# Patient Record
Sex: Female | Born: 1985 | Hispanic: Yes | Smoking: Never smoker
Health system: Southern US, Community
[De-identification: ages and names within clinical notes are randomized; demographics above are authoritative.]

---

## 2014-05-24 NOTE — L&D Delivery Note (Cosign Needed)
Delivery Note Pt pushed well and at 4:34 AM a viable female was delivered via VBAC, Spontaneous (Presentation: Right Occiput Anterior).  APGAR: 9, 9; weight 6 lb 12.6 oz (3080 g).  Cord clamped and cut by FOB; hospital lab cord blood collected. Placenta status: Intact, Spontaneous.  Cord: 3 vessels   Anesthesia: None  Episiotomy: None Lacerations: None Est. Blood Loss (mL): 200  Mom to postpartum.  Baby to Couplet care / Skin to Skin.   Cam HaiSHAW, KIMBERLY CNM 05/08/2015, 4:51 AM

## 2015-01-06 ENCOUNTER — Other Ambulatory Visit (HOSPITAL_COMMUNITY): Payer: Self-pay | Admitting: Physician Assistant

## 2015-01-06 DIAGNOSIS — Z3A22 22 weeks gestation of pregnancy: Secondary | ICD-10-CM

## 2015-01-06 DIAGNOSIS — Z3689 Encounter for other specified antenatal screening: Secondary | ICD-10-CM

## 2015-01-06 LAB — OB RESULTS CONSOLE GC/CHLAMYDIA
CHLAMYDIA, DNA PROBE: NEGATIVE
GC PROBE AMP, GENITAL: NEGATIVE

## 2015-01-06 LAB — OB RESULTS CONSOLE HEPATITIS B SURFACE ANTIGEN: Hepatitis B Surface Ag: NEGATIVE

## 2015-01-06 LAB — OB RESULTS CONSOLE HIV ANTIBODY (ROUTINE TESTING): HIV: NONREACTIVE

## 2015-01-06 LAB — OB RESULTS CONSOLE ABO/RH: RH Type: POSITIVE

## 2015-01-06 LAB — OB RESULTS CONSOLE ANTIBODY SCREEN: Antibody Screen: NEGATIVE

## 2015-01-06 LAB — OB RESULTS CONSOLE RPR: RPR: NONREACTIVE

## 2015-01-06 LAB — OB RESULTS CONSOLE RUBELLA ANTIBODY, IGM: Rubella: IMMUNE

## 2015-01-07 ENCOUNTER — Other Ambulatory Visit (HOSPITAL_COMMUNITY): Payer: Self-pay | Admitting: Physician Assistant

## 2015-01-07 ENCOUNTER — Ambulatory Visit (HOSPITAL_COMMUNITY)
Admission: RE | Admit: 2015-01-07 | Discharge: 2015-01-07 | Disposition: A | Discharge status: Home / Self Care | Payer: Medicaid Other | Source: Ambulatory Visit | Attending: Physician Assistant | Admitting: Physician Assistant

## 2015-01-07 ENCOUNTER — Encounter (HOSPITAL_COMMUNITY): Payer: Self-pay

## 2015-01-07 DIAGNOSIS — Z3A22 22 weeks gestation of pregnancy: Secondary | ICD-10-CM

## 2015-01-07 DIAGNOSIS — Z36 Encounter for antenatal screening of mother: Secondary | ICD-10-CM | POA: Insufficient documentation

## 2015-01-07 DIAGNOSIS — Z3689 Encounter for other specified antenatal screening: Secondary | ICD-10-CM

## 2015-01-07 IMAGING — US US MFM OB COMP +14 WKS
1 series · 12 of 28 positions shown · non-contrast
Comparison: none

[Series 1: us ob +14 all · 12 of 69 slices shown]
[im 3/69]
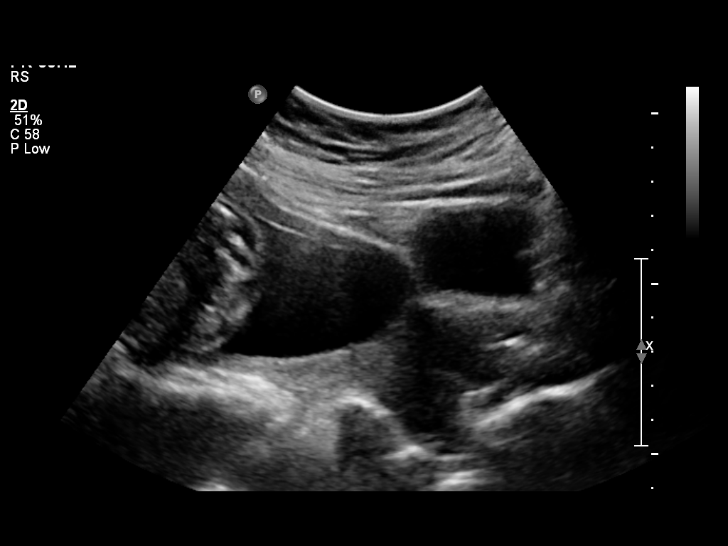
[im 8/69]
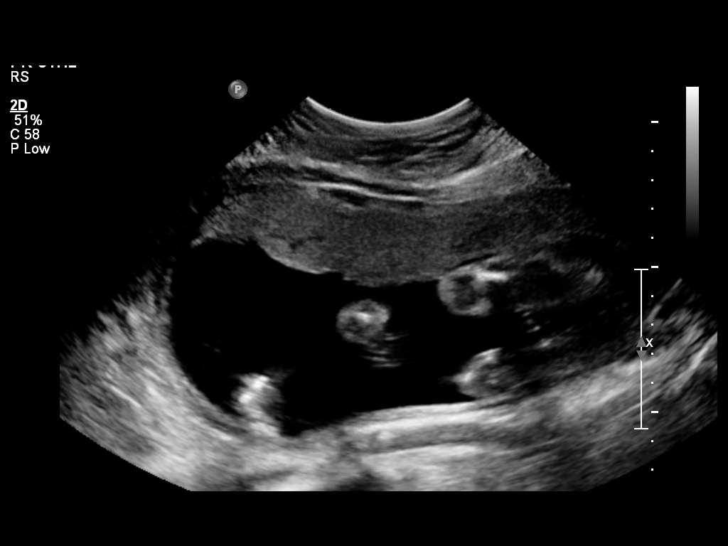
[im 13/69]
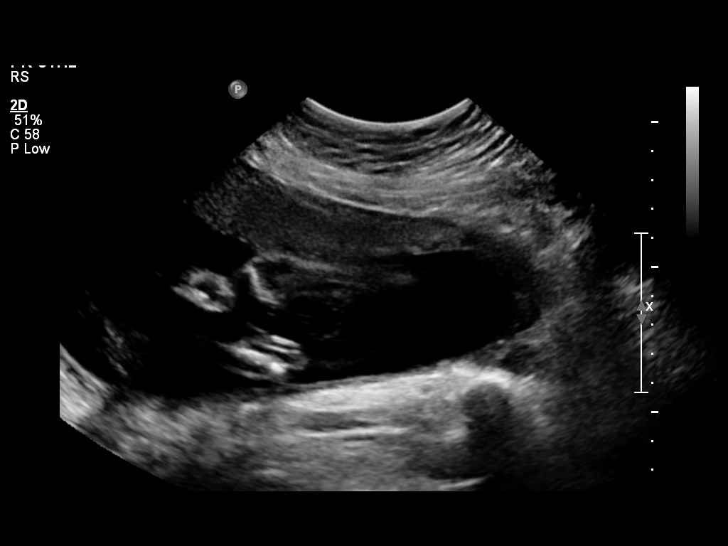
[im 21/69]
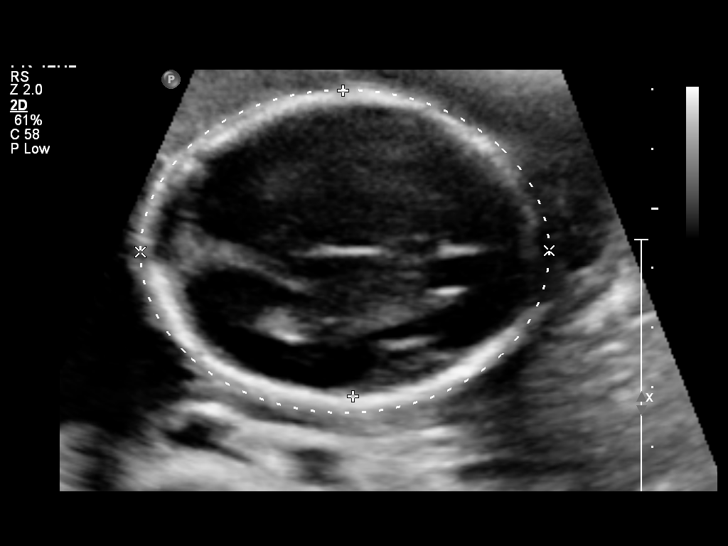
[im 26/69]
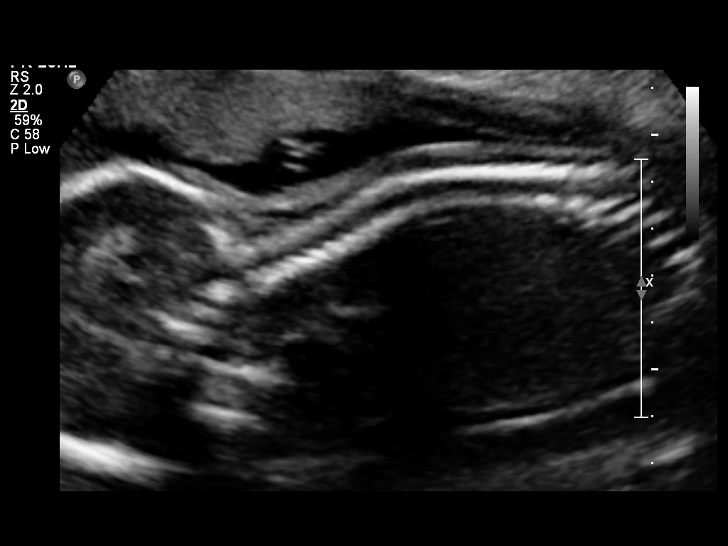
[im 31/69]
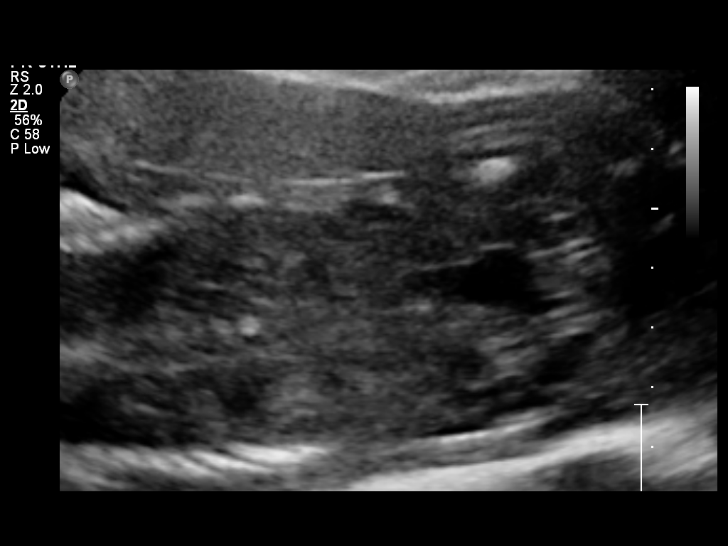
[im 38/69]
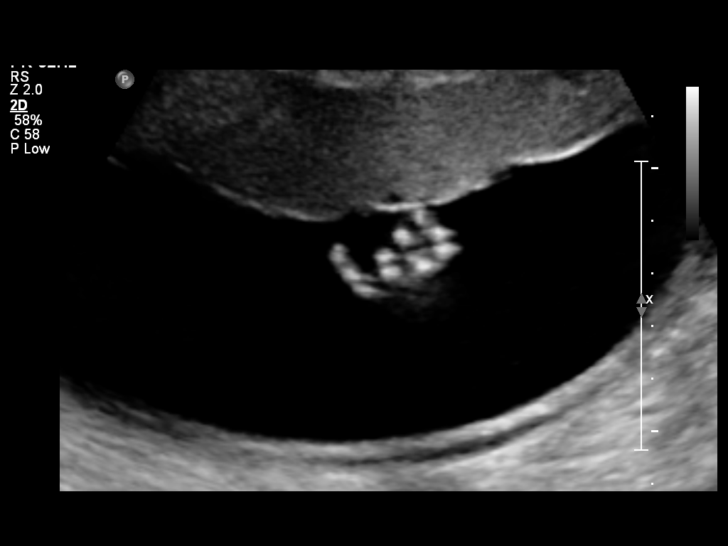
[im 43/69]
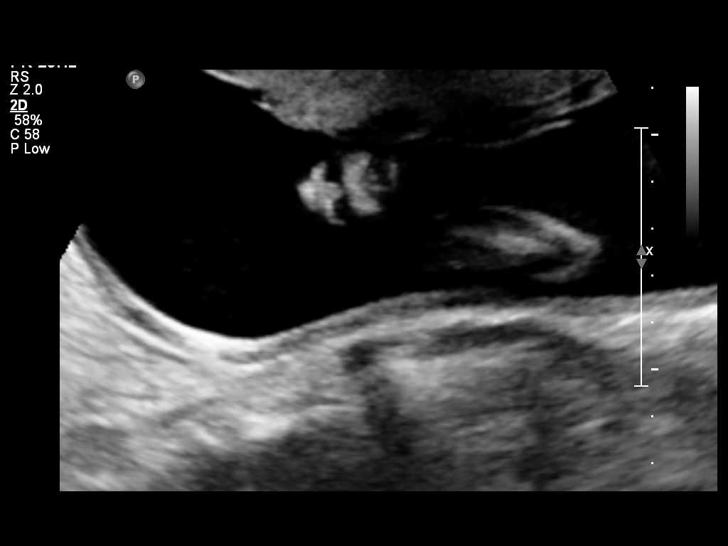
[im 48/69]
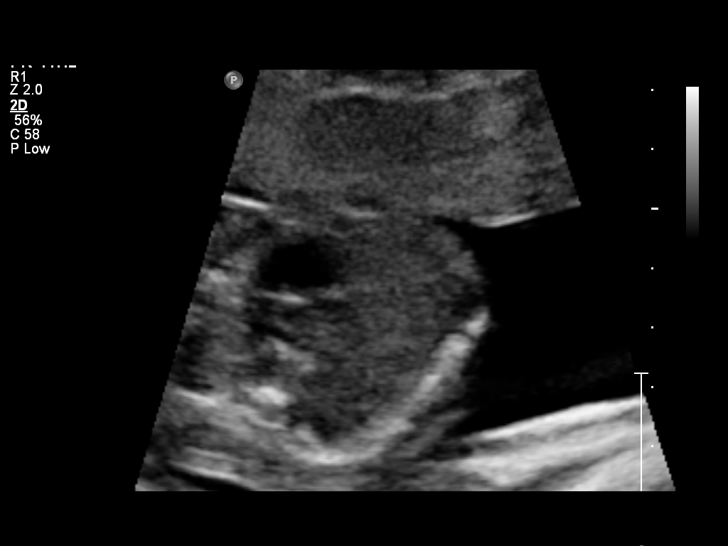
[im 56/69]
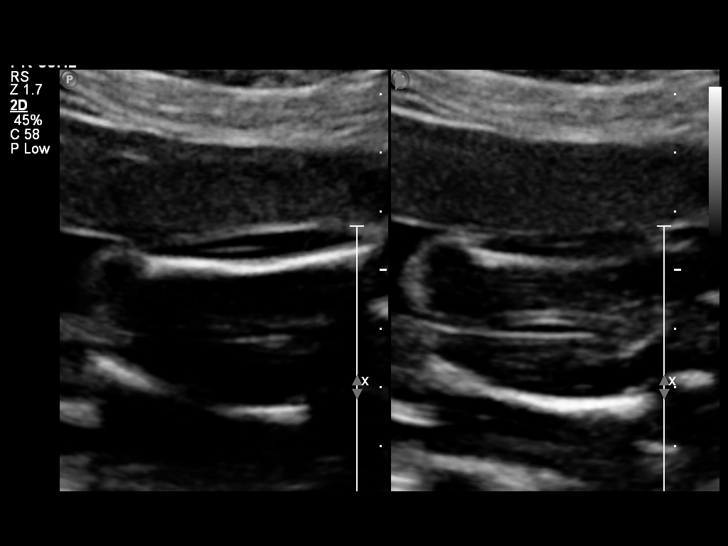
[im 61/69]
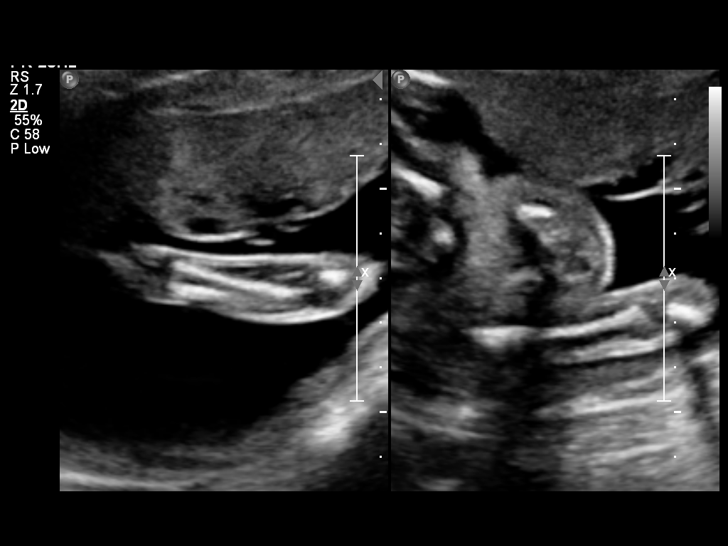
[im 66/69]
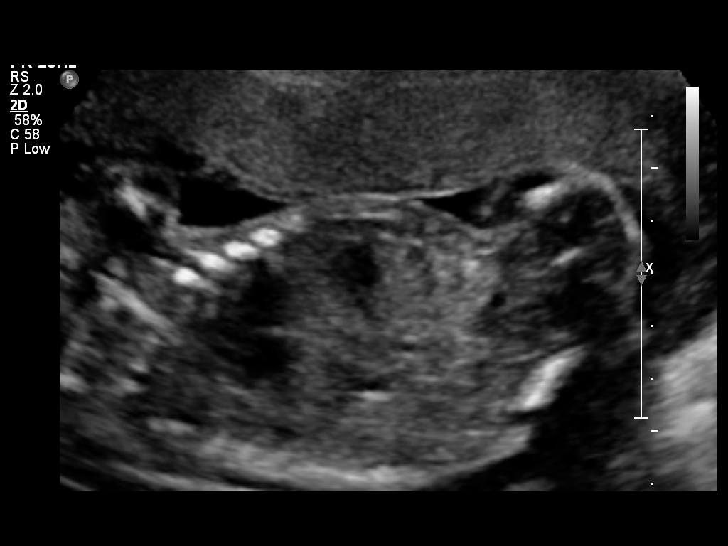

[12 of 28 positions shown; findings below may reference images not displayed]

OBSTETRICS REPORT
(Signed Final [DATE] [DATE])

Name:       DON LOLITO                     Visit  [DATE] [DATE]
Date:

Service(s) Provided

Indications

Basic anatomic survey                                  z36
22 weeks gestation of pregnancy
Fetal Evaluation

Num Of             1
Fetuses:
Fetal Heart        165                          bpm
Rate:
Cardiac Activity:  Observed
Presentation:      Breech
Placenta:          Anterior, above cervical
os
P. Cord            Visualized, central
Insertion:

Amniotic Fluid
AFI FV:      Subjectively within normal limits
Larg Pckt:    4.99   cm
Biometry

BPD:     50.6   m    G. Age:   21w 2d                 CI:        67.58   70 - 86
m
FL/HC:      18.7   18.4 -
20.2
HC:       197   m    G. Age:   21w 6d        34  %    HC/AC:      1.10   1.06 -
m
AC:     179.2   m    G. Age:   22w 6d        67  %    FL/BPD      72.9   71 - 87
m                                     :
FL:      36.9   m    G. Age:   21w 5d        31  %    FL/AC:      20.6   20 - 24
m
HUM:     35.8   m    G. Age:   22w 3d        59  %
m
CER:     22.9   m    G. Age:   21w 3d        34  %
m
Est.         487   gm    1 lb 1 oz      51   %
FW:
Gestational Age

LMP:           22w 0d        Date:  [DATE]                  EDD:   [DATE]
U/S Today:     22w 0d                                         EDD:   [DATE]
Best:          22w 0d    Det. By:   LMP  ([DATE])           EDD:   [DATE]
Anatomy

Cranium:          Appears normal         Aortic Arch:       Appears normal
Fetal Cavum:      Appears normal         Ductal Arch:       Appears normal
Ventricles:       Appears normal         Diaphragm:         Appears normal
Choroid Plexus:   Appears normal         Stomach:           Appears normal,
left sided
Cerebellum:       Appears normal         Abdomen:           Appears normal
Posterior         Appears normal         Abdominal          Appears nml (cord
Fossa:                                   Wall:              insert, abd wall)
Nuchal Fold:      Appears normal         Cord Vessels:      Appears normal (3
vessel cord)
Face:             Appears normal         Kidneys:           Appear normal
(orbits and profile)
Lips:             Appears normal         Bladder:           Appears normal
Heart:            Appears normal         Spine:             Limited views
(4CH, axis, and                           appear normal
situs)
RVOT:             Appears normal         Lower              Appears normal
Extremities:
LVOT:             Appears normal         Upper              Appears normal
Extremities:

Other:   Fetus appears to be a female. Heels visualized. Right 5th digit
visualized. Nasal bone visualized.
Targeted Anatomy

Fetal Central Nervous System
Cisterna
Magna:
Cervix Uterus Adnexa

Cervical Length:    3         cm

Cervix:       Normal appearance by transabdominal scan.
Uterus:       No abnormality visualized.
Cul De Sac:   No free fluid seen.

Left Ovary:    Within normal limits.
Right Ovary:   Within normal limits.

Adnexa:     No abnormality visualized.
Impression

SIUP at 22+0 weeks
Normal detailed fetal anatomy; limited views of spine
Normal amniotic fluid volume
Measurements consistent with LMP dating
Recommendations

Follow-up as clinically indicated

## 2015-04-25 LAB — OB RESULTS CONSOLE GBS: STREP GROUP B AG: POSITIVE

## 2015-05-08 ENCOUNTER — Inpatient Hospital Stay (HOSPITAL_COMMUNITY)
Admission: AD | Admit: 2015-05-08 | Discharge: 2015-05-10 | DRG: 775 | Disposition: A | Discharge status: Home / Self Care | Payer: Medicaid Other | Source: Ambulatory Visit | Attending: Obstetrics and Gynecology | Admitting: Obstetrics and Gynecology

## 2015-05-08 ENCOUNTER — Encounter (HOSPITAL_COMMUNITY): Payer: Self-pay

## 2015-05-08 DIAGNOSIS — O99824 Streptococcus B carrier state complicating childbirth: Secondary | ICD-10-CM

## 2015-05-08 DIAGNOSIS — IMO0001 Reserved for inherently not codable concepts without codable children: Secondary | ICD-10-CM

## 2015-05-08 DIAGNOSIS — O34219 Maternal care for unspecified type scar from previous cesarean delivery: Secondary | ICD-10-CM

## 2015-05-08 DIAGNOSIS — O26893 Other specified pregnancy related conditions, third trimester: Secondary | ICD-10-CM | POA: Diagnosis present

## 2015-05-08 DIAGNOSIS — Z3A39 39 weeks gestation of pregnancy: Secondary | ICD-10-CM

## 2015-05-08 DIAGNOSIS — B951 Streptococcus, group B, as the cause of diseases classified elsewhere: Secondary | ICD-10-CM

## 2015-05-08 LAB — CBC
HEMATOCRIT: 42 % (ref 36.0–46.0)
HEMOGLOBIN: 14.1 g/dL (ref 12.0–15.0)
MCH: 28.7 pg (ref 26.0–34.0)
MCHC: 33.6 g/dL (ref 30.0–36.0)
MCV: 85.5 fL (ref 78.0–100.0)
Platelets: 177 10*3/uL (ref 150–400)
RBC: 4.91 MIL/uL (ref 3.87–5.11)
RDW: 15 % (ref 11.5–15.5)
WBC: 7.7 10*3/uL (ref 4.0–10.5)

## 2015-05-08 LAB — RPR: RPR Ser Ql: NONREACTIVE

## 2015-05-08 MED ORDER — SIMETHICONE 80 MG PO CHEW: 80.0000 mg | CHEWABLE_TABLET | ORAL | Status: DC | PRN

## 2015-05-08 MED ORDER — OXYTOCIN 40 UNITS IN LACTATED RINGERS INFUSION - SIMPLE MED
62.5000 mL/h | INTRAVENOUS | Status: DC
  Filled 2015-05-08: qty 1000

## 2015-05-08 MED ORDER — ONDANSETRON HCL 4 MG PO TABS: 4.0000 mg | ORAL_TABLET | ORAL | Status: DC | PRN

## 2015-05-08 MED ORDER — PRENATAL MULTIVITAMIN CH
1.0000 | ORAL_TABLET | Freq: Every day | ORAL | Status: DC
  Administered 2015-05-08 – 2015-05-09 (×2): 1 via ORAL
  Filled 2015-05-08 (×2): qty 1

## 2015-05-08 MED ORDER — ONDANSETRON HCL 4 MG/2ML IJ SOLN: 4.0000 mg | Freq: Four times a day (QID) | INTRAMUSCULAR | Status: DC | PRN

## 2015-05-08 MED ORDER — OXYCODONE-ACETAMINOPHEN 5-325 MG PO TABS: 2.0000 | ORAL_TABLET | ORAL | Status: DC | PRN

## 2015-05-08 MED ORDER — OXYTOCIN BOLUS FROM INFUSION
500.0000 mL | INTRAVENOUS | Status: DC
  Administered 2015-05-08: 500 mL via INTRAVENOUS

## 2015-05-08 MED ORDER — SODIUM CHLORIDE 0.9 % IV SOLN
2.0000 g | Freq: Once | INTRAVENOUS | Status: AC
  Administered 2015-05-08: 2 g via INTRAVENOUS
  Filled 2015-05-08: qty 2000

## 2015-05-08 MED ORDER — ONDANSETRON HCL 4 MG/2ML IJ SOLN
4.0000 mg | INTRAMUSCULAR | Status: DC | PRN
Start: 2015-05-08 — End: 2015-05-10

## 2015-05-08 MED ORDER — LACTATED RINGERS IV SOLN
INTRAVENOUS | Status: DC
  Administered 2015-05-08: 03:00:00 via INTRAVENOUS

## 2015-05-08 MED ORDER — SENNOSIDES-DOCUSATE SODIUM 8.6-50 MG PO TABS
2.0000 | ORAL_TABLET | ORAL | Status: DC
  Administered 2015-05-08 – 2015-05-10 (×2): 2 via ORAL
  Filled 2015-05-08 (×2): qty 2

## 2015-05-08 MED ORDER — ACETAMINOPHEN 325 MG PO TABS: 650.0000 mg | ORAL_TABLET | ORAL | Status: DC | PRN

## 2015-05-08 MED ORDER — LACTATED RINGERS IV SOLN: 500.0000 mL | INTRAVENOUS | Status: DC | PRN

## 2015-05-08 MED ORDER — OXYCODONE-ACETAMINOPHEN 5-325 MG PO TABS: 1.0000 | ORAL_TABLET | ORAL | Status: DC | PRN

## 2015-05-08 MED ORDER — IBUPROFEN 600 MG PO TABS
600.0000 mg | ORAL_TABLET | Freq: Four times a day (QID) | ORAL | Status: DC
  Administered 2015-05-08 – 2015-05-10 (×8): 600 mg via ORAL
  Filled 2015-05-08 (×8): qty 1

## 2015-05-08 MED ORDER — BENZOCAINE-MENTHOL 20-0.5 % EX AERO: 1.0000 "application " | INHALATION_SPRAY | CUTANEOUS | Status: DC | PRN

## 2015-05-08 MED ORDER — WITCH HAZEL-GLYCERIN EX PADS: 1.0000 "application " | MEDICATED_PAD | CUTANEOUS | Status: DC | PRN

## 2015-05-08 MED ORDER — DIPHENHYDRAMINE HCL 25 MG PO CAPS: 25.0000 mg | ORAL_CAPSULE | Freq: Four times a day (QID) | ORAL | Status: DC | PRN

## 2015-05-08 MED ORDER — FLEET ENEMA 7-19 GM/118ML RE ENEM: 1.0000 | ENEMA | RECTAL | Status: DC | PRN

## 2015-05-08 MED ORDER — DIBUCAINE 1 % RE OINT: 1.0000 | TOPICAL_OINTMENT | RECTAL | Status: DC | PRN

## 2015-05-08 MED ORDER — TETANUS-DIPHTH-ACELL PERTUSSIS 5-2.5-18.5 LF-MCG/0.5 IM SUSP: 0.5000 mL | Freq: Once | INTRAMUSCULAR | Status: DC

## 2015-05-08 MED ORDER — LIDOCAINE HCL (PF) 1 % IJ SOLN
30.0000 mL | INTRAMUSCULAR | Status: DC | PRN
  Filled 2015-05-08: qty 30

## 2015-05-08 MED ORDER — CITRIC ACID-SODIUM CITRATE 334-500 MG/5ML PO SOLN: 30.0000 mL | ORAL | Status: DC | PRN

## 2015-05-08 MED ORDER — ZOLPIDEM TARTRATE 5 MG PO TABS: 5.0000 mg | ORAL_TABLET | Freq: Every evening | ORAL | Status: DC | PRN

## 2015-05-08 MED ORDER — LANOLIN HYDROUS EX OINT: TOPICAL_OINTMENT | CUTANEOUS | Status: DC | PRN

## 2015-05-08 NOTE — Lactation Note (Signed)
This note was copied from the chart of Kerri Fernandez. Lactation Consultation Note  Patient Name: Kerri Salomon [facility]Rosa [facility] Colony[facility] [id number]'s Date: 05/08/2015 Reason for consult: Initial assessment   Spoke with mother with assistance of Hospital Interpreter [place] Alvarez. Mom is an experienced BF mom. Infant has BF x 2 for 10 minutes. She is currently asleep beside mom. Enc mom to feed 8-12 x in 24 hours at first feeding cues. Discussed NB feeding behavior, stomach size colostrum, milk coming to volume, NB Nutritional needs, I/O and cluster feeding. Gave mom Spanish [employer] Brochure, advised on OP Services, Support Groups, and phone #. [facility] and has a Copywriter, advertisingersonal DEBP at home. Mom without questions at this time. Enc mom to call for assistance as needed.   Maternal Data Formula Feeding for Exclusion: No Does the patient have breastfeeding experience prior to this delivery?: Yes  Feeding Feeding Type: Breast Fed Length of feed: 10 min  LATCH Score/Interventions                      Lactation Tools Discussed/Used WIC Program: Yes   Consult Status Consult Status: Follow-up Date: 05/09/15 Follow-up type: In-patient    Silas FloodSharon S Hice 05/08/2015, 11:30 AM

## 2015-05-08 NOTE — MAU Note (Signed)
Ctx since mdnight. Mucus plug fell out. Good fetal movemnt. V-back

## 2015-05-08 NOTE — H&P (Signed)
Kerri Fernandez is a 29 y.o. female 533P2002 @ 39.2wks by LMP and confirmed by 22wk scan presenting with reg ctx since midnight. Denies leaking or bldg. Reports +FM. [facility] and has been remarkable for 1) vag del followed by C/S for FTP- desires VBAC and consent signed 2) GBS pos 3) abnormal glucola followed by nl 3hr  History OB History    Gravida Para Term Preterm AB TAB SAB Ectopic Multiple Living   1              No past medical history on file. No past surgical history on file. Family History: family history is not on file. Social History:  has no tobacco, alcohol, and drug history on file.   Prenatal Transfer Tool  Maternal Diabetes: No Genetic Screening: Declined Maternal Ultrasounds/Referrals: Normal Fetal Ultrasounds or other Referrals:  None Maternal Substance Abuse:  No Significant Maternal Medications:  None Significant Maternal Lab Results:  Lab values include: Group B Strep positive Other Comments:  None  ROS Cx in MAU: rim/membranes palpated/vtx   Height 5\' 2"  (1.575 m), weight 83.915 kg (185 lb). Exam Physical Exam  Constitutional: She is oriented to person, place, and time. She appears well-developed.  HENT:  Head: Normocephalic.  Neck: Normal range of motion.  Cardiovascular: Normal rate.   Respiratory: Effort normal.  GI:  EFM 150s, +accels, occ mi variables Ctx q 3-4 mins  Musculoskeletal: Normal range of motion.  Neurological: She is alert and oriented to person, place, and time.  Skin: Skin is warm and dry.  Psychiatric: She has a normal mood and affect. Her behavior is normal. Thought content normal.    Prenatal labs: ABO, Rh: O/Positive/-- (08/15 0000) Antibody: Negative (08/15 0000) Rubella: Immune (08/15 0000) RPR: Nonreactive (08/15 0000)  HBsAg: Negative (08/15 0000)  HIV: Non-reactive (08/15 0000)  GBS: Positive (12/02 0000)   Assessment/Plan: IUP@term  Active labor/transition TOLAC GBS pos  [facility] Amp for GBS ppx Anticipate SVD   Makiyah Zentz CNM 05/08/2015, 3:22 AM

## 2015-05-08 NOTE — Progress Notes (Signed)
Notified of pt arrival and vaginal exam of 9cm. Will admit to labor and delivery

## 2015-05-08 NOTE — Progress Notes (Signed)
I stopped to check on patients need, I ordered her meals, also I assisted [facility]by Orlan LeavensViria Alvarez, Spanish Interpreter

## 2015-05-09 NOTE — Progress Notes (Signed)
I assisted Drenda FreezeFran CNM with questions, oredered meals, by Orlan LeavensViria Alvarez Spanish Interpreter.

## 2015-05-09 NOTE — Progress Notes (Signed)
Post Partum Day 1 Subjective: no complaints, up ad lib, voiding and tolerating PO, small lochia, plans to breastfeed, undecided about BC, maybe condoms  Objective: Blood pressure 105/65, pulse 75, temperature 98 F (36.7 C), temperature source Oral, resp. rate 16, height 5\' 2"  (1.575 m), weight 83.915 kg (185 lb), SpO2 99 %, unknown if currently breastfeeding.  Physical Exam:  General: alert, cooperative and no distress Lochia:normal flow Chest: CTAB Heart: RRR no m/r/g Abdomen: +BS, soft, nontender,  Uterine Fundus: firm DVT Evaluation: No evidence of DVT seen on physical exam. Extremities: no edema   Recent Labs  05/08/15 0320  HGB 14.1  HCT 42.0    Assessment/Plan: Plan for discharge tomorrow   LOS: 1 day   CRESENZO-DISHMAN,Joram Venson 05/09/2015, 7:31 AM

## 2015-05-10 DIAGNOSIS — IMO0001 Reserved for inherently not codable concepts without codable children: Secondary | ICD-10-CM

## 2015-05-10 DIAGNOSIS — B951 Streptococcus, group B, as the cause of diseases classified elsewhere: Secondary | ICD-10-CM

## 2015-05-10 MED ORDER — PRENATAL MULTIVITAMIN CH: 1.0000 | ORAL_TABLET | Freq: Every day | ORAL | Status: AC

## 2015-05-10 MED ORDER — IBUPROFEN 600 MG PO TABS: 600.0000 mg | ORAL_TABLET | Freq: Four times a day (QID) | ORAL | Status: AC

## 2015-05-10 NOTE — Discharge Summary (Signed)
OB Discharge Summary     Patient Name: Kerri Fernandez DOB: 04-08-1986 MRN: [id number]  Date of admission: 05/08/2015 Delivering MD: Cam Hai D   Date of discharge: 05/10/2015  Admitting diagnosis: 39.2 WEEKS CTX Intrauterine pregnancy: Unknown     Secondary diagnosis:  Principal Problem:   Status post vaginal delivery Active Problems:   Normal labor and delivery   Positive GBS test  Additional problems:None      Discharge diagnosis: Term Pregnancy Delivered                                                                                                Post partum procedures:None   Augmentation: None  Complications: None  Hospital course:  Onset of Labor With Vaginal Delivery     29 y.o. yo G3P2001 at Unknown was admitted in Active Laboron 05/08/2015. Patient had an uncomplicated labor course as follows:  Membrane Rupture Time/Date: 4:25 AM ,05/08/2015   Intrapartum Procedures: Episiotomy: None [1]                                         Lacerations:  None [1]  Patient had a delivery of a Viable infant. 05/08/2015  Information for the patient's newborn:  Annielee, Jemmott [[id number]]  Delivery Method: Vag-Spont    Pateint had an uncomplicated postpartum course.  She is ambulating, tolerating a regular diet, passing flatus, and urinating well. Patient is discharged home in stable condition on 05/10/2015   Physical exam  Filed Vitals:   05/08/15 2053 05/09/15 0609 05/09/15 1830 05/10/15 0643  BP: 98/60 105/65 102/73 105/67  Pulse: 67 75 77 73  Temp: 97.9 F (36.6 C) 98 F (36.7 C) 98.3 F (36.8 C) 97.5 F (36.4 C)  TempSrc: Oral Oral Oral Axillary  Resp: Height:      Weight:      SpO2: 99%      General: alert and cooperative Lochia: appropriate Uterine Fundus: firm Incision: N/A DVT Evaluation: No evidence of DVT seen on physical exam. Labs: Lab Results  Component Value Date   WBC 7.7 05/08/2015   HGB 14.1 05/08/2015   HCT 42.0 05/08/2015   MCV 85.5 05/08/2015   PLT 177 05/08/2015   No flowsheet data found.  Discharge instruction: per After Visit Summary and "Baby and Me Booklet".  After visit meds:    Medication List    Notice    You have not been prescribed any medications.      Diet: routine diet  Activity: Advance as tolerated. Pelvic rest for 6 weeks.   Outpatient follow up:6 weeks  Postpartum contraception: Nexplanon  Newborn Data: Live born female  Birth Weight: 6 lb 12.6 oz (3080 g) APGAR: 9, 9  Baby Feeding: Breast Disposition:home with mother   05/10/2015 Danella Maiers, MD  OB fellow attestation I have seen and examined this patient and agree with above documentation in the resident's note.   Kerri Fernandez is a 29 y.o. G3P2001 s/p NSVD.  Pain is well controlled.  Plan for birth control is Nexplanon.  Method of Feeding: breast  PE:  BP 105/67 mmHg  Pulse 73  Temp(Src) 97.5 F (36.4 C) (Axillary)  Resp 18  Ht 5\' 2"  (1.575 m)  Wt 185 lb (83.915 kg)  BMI 33.83 kg/m2  SpO2 99%  Breastfeeding? Unknown Gen: well appearing Heart: reg rate Lungs: normal WOB Fundus firm Ext: soft, no pain, no edema   Recent Labs  05/08/15 0320  HGB 14.1  HCT 42.0   Plan: discharge today - postpartum care discussed - f/u clinic in 6 weeks for postpartum visit  Federico FlakeKimberly Niles Effrey Davidow, MD 5:59 PM

## 2015-05-10 NOTE — Discharge Instructions (Signed)

## 2015-05-10 NOTE — Lactation Note (Signed)
This note was copied from the chart of [facility]. Lactation Consultation Note  Patient Name: Kerri Salomon FickRosa Fernandez [id number]'s Date: 05/10/2015 Reason for consult: Follow-up assessment   With this experienced breast feeding mom and term baby.om reports breast feeding going well, and denies any questions/concerns at this time. Breast care/engorgement reviewed with mom. Spanich interpreter present during  my consult.    Maternal Data    Feeding Length of feed: 10 min  LATCH Score/Interventions Latch: Grasps breast easily, tongue down, lips flanged, rhythmical sucking.  Audible Swallowing: Spontaneous and intermittent  Type of Nipple: Everted at rest and after stimulation  Comfort (Breast/Nipple): Filling, red/small blisters or bruises, mild/mod discomfort  Problem noted: Filling;Mild/Moderate discomfort Interventions (Filling): Frequent nursing;Hand pump Interventions (Mild/moderate discomfort): Pre-pump if needed (EBM, coconut or olive oil instead of lanolin)  Hold (Positioning): Assistance needed to correctly position infant at breast and maintain latch. Intervention(s): Breastfeeding basics reviewed;Support Pillows;Position options  LATCH Score: 8  Lactation Tools Discussed/Used     Consult Status Consult Status: Complete    Alfred LevinsLee, Lynlee Stratton Anne 05/10/2015, 10:29 AM

## 2015-05-19 ENCOUNTER — Encounter (HOSPITAL_COMMUNITY): Payer: Self-pay | Admitting: *Deleted

## 2016-12-24 ENCOUNTER — Ambulatory Visit (INDEPENDENT_AMBULATORY_CARE_PROVIDER_SITE_OTHER): Payer: Self-pay | Admitting: Family Medicine

## 2016-12-24 ENCOUNTER — Encounter: Payer: Self-pay | Admitting: Family Medicine

## 2016-12-24 VITALS — BP 114/73 | HR 74 | Temp 98.7°F | Resp 16 | Ht 60.0 in | Wt 155.0 lb

## 2016-12-24 DIAGNOSIS — R300 Dysuria: Secondary | ICD-10-CM

## 2016-12-24 DIAGNOSIS — N898 Other specified noninflammatory disorders of vagina: Secondary | ICD-10-CM

## 2016-12-24 LAB — POC URINALSYSI DIPSTICK (AUTOMATED)
BILIRUBIN UA: NEGATIVE
GLUCOSE UA: NEGATIVE
Ketones, UA: NEGATIVE
LEUKOCYTES UA: NEGATIVE
NITRITE UA: NEGATIVE
Protein, UA: NEGATIVE
Spec Grav, UA: <=1.005 — AB (ref 1.010–1.025)
UROBILINOGEN UA: 0.2 U/dL
pH, UA: 5.5 (ref 5.0–8.0)

## 2016-12-24 LAB — POCT WET + KOH PREP
TRICH BY WET PREP: ABSENT
YEAST BY WET PREP: ABSENT
Yeast by KOH: ABSENT

## 2016-12-24 LAB — POC MICROSCOPIC URINALYSIS (UMFC): Mucus: ABSENT

## 2016-12-24 MED ORDER — METRONIDAZOLE 500 MG PO TABS: 500.0000 mg | ORAL_TABLET | Freq: Two times a day (BID) | ORAL | 0 refills | Status: AC

## 2016-12-24 NOTE — Progress Notes (Signed)
  Chief Complaint  Patient presents with  . Dysuria    malodorous and pain    HPI  History reviewed. No pertinent past medical history.  Current Outpatient Prescriptions  Medication Sig Dispense Refill  . ibuprofen (ADVIL,MOTRIN) 600 MG tablet Take 1 tablet (600 mg total) by mouth every 6 (six) hours. (Patient not taking: Reported on 12/24/2016) 90 tablet 0  . Prenatal Vit-Fe Fumarate-FA (PRENATAL MULTIVITAMIN) TABS tablet Take 1 tablet by mouth daily at 12 noon. (Patient not taking: Reported on 12/24/2016) 30 tablet 0   No current facility-administered medications for this visit.     Allergies: No Known Allergies  History reviewed. No pertinent surgical history.  Social History   Social History  . Marital status: Significant Other    Spouse name: N/A  . Number of children: N/A  . Years of education: N/A   Social History Main Topics  . Smoking status: Never Smoker  . Smokeless tobacco: Never Used  . Alcohol use No  . Drug use: No  . Sexual activity: Not Asked   Other Topics Concern  . None   Social History Narrative  . None    ROS  Objective: Vitals:   12/24/16 1345  BP: 114/73  Pulse: 74  Resp: 16  Temp: 98.7 F (37.1 C)  TempSrc: Oral  SpO2: 99%  Weight: 155 lb (70.3 kg)  Height: 5' (1.524 m)    Physical Exam  Assessment and Plan Kerri Fernandez was seen today for dysuria.  Diagnoses and all orders for this visit:  Dysuria -     POCT Microscopic Urinalysis ([facility]) -     POCT Urinalysis Dipstick (Automated)  Vaginal discharge -     POCT Wet + KOH Prep     Kerri Fernandez

## 2016-12-24 NOTE — Progress Notes (Signed)
  Chief Complaint  Patient presents with  . Dysuria    malodorous and pain    HPI ID number [id number] Pt has been having pain with urination After her period she has been having 2 weeks of itchiness burning and vaginal discharge with bad odor Patient's last menstrual period was 12/05/2016 (approximate). She uses the nexplanon implant She does not douche She has a pap smear during her pregnancy 1.5 years ago She is not breast    History reviewed. No pertinent past medical history.  Current Outpatient Prescriptions  Medication Sig Dispense Refill  . ibuprofen (ADVIL,MOTRIN) 600 MG tablet Take 1 tablet (600 mg total) by mouth every 6 (six) hours. (Patient not taking: Reported on 12/24/2016) 90 tablet 0  . Prenatal Vit-Fe Fumarate-FA (PRENATAL MULTIVITAMIN) TABS tablet Take 1 tablet by mouth daily at 12 noon. (Patient not taking: Reported on 12/24/2016) 30 tablet 0   No current facility-administered medications for this visit.     Allergies: No Known Allergies  History reviewed. No pertinent surgical history.  Social History   Social History  . Marital status: Significant Other    Spouse name: N/A  . Number of children: N/A  . Years of education: N/A   Social History Main Topics  . Smoking status: Never Smoker  . Smokeless tobacco: Never Used  . Alcohol use No  . Drug use: No  . Sexual activity: Not Asked   Other Topics Concern  . None   Social History Narrative  . None    ROS See hpi  Objective: Vitals:   12/24/16 1345  BP: 114/73  Pulse: 74  Resp: 16  Temp: 98.7 F (37.1 C)  TempSrc: Oral  SpO2: 99%  Weight: 155 lb (70.3 kg)  Height: 5' (1.524 m)    Physical Exam  Vaginal  Chaperone present Labia normal bilaterally without skin lesions Urethral meatus normal appearing without erythema Vagina with gray, watery discharge No CMT, ovaries small and not palpable Uterus midline, nontender  Component     Latest Ref Rng & Units 12/24/2016  Color, UA   yellow  Clarity, UA      clear  Glucose      negative  Bilirubin, UA      negative  Ketones, UA      negative  Specific Gravity, UA     1.010 - 1.025 <=1.005 (A)  RBC, UA      trace-intact  pH, UA     5.0 - 8.0 5.5  Protein, UA      negative  Urobilinogen, UA     0.2 or 1.0 E.U./dL 0.2  Nitrite, UA      negative  Leukocytes, UA     Negative Negative    Assessment and Plan Enslee was seen today for dysuria.  Diagnoses and all orders for this visit:  Dysuria- -     POCT Microscopic Urinalysis ([facility]) -     POCT Urinalysis Dipstick (Automated)  Vaginal discharge-  Negative for UTI Wet prep positive for clue cells BV  Will treat with metronidazole -     POCT Wet + KOH Prep     Enoc Getter A Alexes Menchaca

## 2016-12-24 NOTE — Patient Instructions (Addendum)
   IF you received an x-ray today, you will receive an invoice from [facility]. Please contact [facility] at [phone] with questions or concerns regarding your invoice.   IF you received labwork today, you will receive an invoice from [facility]. Please contact [facility] at [phone] with questions or concerns regarding your invoice.   Our billing staff will not be able to assist you with questions regarding bills from these companies.  You will be contacted with the lab results as soon as they are available. The fastest way to get your results is to activate your My Chart account. Instructions are located on the last page of this paperwork. If you have not heard from us regarding the results in 2 weeks, please contact this office.     Bacterial Vaginosis Bacterial vaginosis is an infection of the vagina. It happens when too many germs (bacteria) grow in the vagina. This infection puts you at risk for infections from sex (STIs). Treating this infection can lower your risk for some STIs. You should also treat this if you are pregnant. It can cause your baby to be born early. Follow these instructions at home: Medicines  Take over-the-counter and prescription medicines only as told by your doctor.  Take or use your antibiotic medicine as told by your doctor. Do not stop taking or using it even if you start to feel better. General instructions  If you your sexual partner is a woman, tell her that you have this infection. She needs to get treatment if she has symptoms. If you have a female partner, he does not need to be treated.  During treatment: ? Avoid sex. ? Do not douche. ? Avoid alcohol as told. ? Avoid breastfeeding as told.  Drink enough fluid to keep your pee (urine) clear or pale yellow.  Keep your vagina and butt (rectum) clean. ? Wash the area with warm water every day. ? Wipe from front to back after you use the toilet.  Keep all follow-up visits  as told by your doctor. This is important. Preventing this condition  Do not douche.  Use only warm water to wash around your vagina.  Use protection when you have sex. This includes: ? Latex condoms. ? Dental dams.  Limit how many people you have sex with. It is best to only have sex with the same person (be monogamous).  Get tested for STIs. Have your partner get tested.  Wear underwear that is cotton or lined with cotton.  Avoid tight pants and pantyhose. This is most important in summer.  Do not use any products that have nicotine or tobacco in them. These include cigarettes and e-cigarettes. If you need help quitting, ask your doctor.  Do not use illegal drugs.  Limit how much alcohol you drink. Contact a doctor if:  Your symptoms do not get better, even after you are treated.  You have more discharge or pain when you pee (urinate).  You have a fever.  You have pain in your belly (abdomen).  You have pain with sex.  Your bleed from your vagina between periods. Summary  This infection happens when too many germs (bacteria) grow in the vagina.  Treating this condition can lower your risk for some infections from sex (STIs).  You should also treat this if you are pregnant. It can cause early (premature) birth.  Do not stop taking or using your antibiotic medicine even if you start to feel better. This information is not intended to   replace advice given to you by your health care provider. Make sure you discuss any questions you have with your health care provider. Document Released: 02/17/2008 Document Revised: 01/24/2016 Document Reviewed: 01/24/2016 Elsevier Interactive Patient Education  2017 [employer]
